# Patient Record
Sex: Male | Born: 1995 | Race: White | Hispanic: No | Marital: Single | Smoking: Current every day smoker
Health system: Southern US, Community
[De-identification: ages and names within clinical notes are randomized; demographics above are authoritative.]

---
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
## 2019-08-28 ENCOUNTER — Other Ambulatory Visit: Payer: Self-pay

## 2019-08-28 ENCOUNTER — Ambulatory Visit
Admission: EM | Admit: 2019-08-28 | Discharge: 2019-08-28 | Disposition: A | Discharge status: Home / Self Care | Payer: Commercial Managed Care - PPO | Attending: Emergency Medicine | Admitting: Emergency Medicine

## 2019-08-28 DIAGNOSIS — H60332 Swimmer's ear, left ear: Secondary | ICD-10-CM | POA: Diagnosis not present

## 2019-08-28 MED ORDER — NEOMYCIN-POLYMYXIN-HC 3.5-10000-1 OT SUSP: 4.0000 [drp] | Freq: Three times a day (TID) | OTIC | 0 refills | Status: AC

## 2019-08-28 NOTE — Discharge Instructions (Addendum)
Use for eardrops, 3 times daily as discussed at appointment. Return for worsening pain, difficulty chewing, nose pain, fever. To help prevent ear infections, use a solution of 1 part isopropyl (rubbing) alcohol, 1 part white vinegar (acetic acid) in both ears after swimming.

## 2019-08-28 NOTE — ED Provider Notes (Signed)
[facility]    CSN: [id number] Arrival date & time: 08/28/19  1151      History   Chief Complaint Chief Complaint  Patient presents with   left ear pain    HPI Allen Romero is a 23 y.o. male presenting for 2-day course of left ear pain with radiation to jaw.  States he went swimming a week ago and upon.  Tried taking 600 mg ibuprofen this morning without pain relief.  Denies history of recurrent ear infections.  Has not tried anything else for this.  Denies tinnitus, change in hearing, discharge or trauma to the area.    History reviewed. No pertinent past medical history.  There are no active problems to display for this patient.   History reviewed. No pertinent surgical history.     Home Medications    Prior to Admission medications   Medication Sig Start Date End Date Taking? Authorizing Provider  neomycin-polymyxin-hydrocortisone (CORTISPORIN) 3.5-10000-1 OTIC suspension Place 4 drops into the left ear 3 (three) times daily. 08/28/19   Hall-Potvin, [place], PA-C    Family History Family History  Problem Relation Age of Onset   Healthy Mother    Asthma Father     Social History Social History   Tobacco Use   Smoking status: Current Every Day Smoker   Smokeless tobacco: Never Used   Tobacco comment: vaping everyday  Substance Use Topics   Alcohol use: Yes    Comment: 5 beers a week   Drug use: Not on file     Allergies   Patient has no known allergies.   Review of Systems Review of Systems  Constitutional: Negative for fatigue and fever.  HENT: Positive for ear pain. Negative for congestion, dental problem, ear discharge, facial swelling, hearing loss, sinus pain, sore throat, trouble swallowing and voice change.   Eyes: Negative for photophobia, pain and visual disturbance.  Respiratory: Negative for cough and shortness of breath.   Cardiovascular: Negative for chest pain and palpitations.  Gastrointestinal: Negative for  abdominal pain, diarrhea and vomiting.  Musculoskeletal: Negative for arthralgias and myalgias.  Skin: Negative for rash and wound.  Neurological: Negative for dizziness, speech difficulty and headaches.  All other systems reviewed and are negative.    Physical Exam Triage Vital Signs ED Triage Vitals  Enc Vitals Group     BP 08/28/19 1210 110/62     Pulse Rate 08/28/19 1210 63     Resp 08/28/19 1210 16     Temp 08/28/19 1210 98 F (36.7 C)     Temp Source 08/28/19 1210 Oral     SpO2 08/28/19 1210 98 %     Weight --      Height --      Head Circumference --      Peak Flow --      Pain Score 08/28/19 1211 6     Pain Loc --      Pain Edu? --      Excl. in GC? --    No data found.  Updated Vital Signs BP 110/62 (BP Location: Left Arm)    Pulse 63    Temp 98 F (36.7 C) (Oral)    Resp 16    SpO2 98%    Physical Exam Constitutional:      General: He is not in acute distress.    Appearance: He is normal weight. He is not ill-appearing.  HENT:     Head: Normocephalic and atraumatic.  Right Ear: Tympanic membrane, ear canal and external ear normal.     Ears:     Comments: Left ear positive for tragal tenderness, mastoid process tenderness without pre-or postauricular lymphadenopathy.  EAC moderately edematous with scant waxy discharge.  TM without injection, perforation, purulent fluid interiorly.    Nose: No nasal deformity, congestion or rhinorrhea.     Comments: Turbinates nonedematous bilaterally with pink mucosa    Mouth/Throat:     Mouth: Mucous membranes are moist.     Tongue: Tongue does not deviate from midline.     Pharynx: Oropharynx is clear. Uvula midline.     Comments: No tonsillar hypertrophy or exudate Eyes:     General: No scleral icterus.    Conjunctiva/sclera: Conjunctivae normal.     Pupils: Pupils are equal, round, and reactive to light.  Neck:     Musculoskeletal: Normal range of motion and neck supple. No muscular tenderness.  Cardiovascular:      Rate and Rhythm: Normal rate.  Pulmonary:     Effort: Pulmonary effort is normal. No respiratory distress.     Breath sounds: No wheezing.  Lymphadenopathy:     Cervical: No cervical adenopathy.  Skin:    Coloration: Skin is not jaundiced or pale.  Neurological:     Mental Status: He is alert and oriented to person, place, and time.      UC Treatments / Results  Labs (all labs ordered are listed, but only abnormal results are displayed) Labs Reviewed - No data to display  EKG   Radiology No results found.  Procedures Procedures (including critical care time)  Medications Ordered in UC Medications - No data to display  Initial Impression / Assessment and Plan / UC Course  I have reviewed the triage vital signs and the nursing notes.  Pertinent labs & imaging results that were available during my care of the patient were reviewed by me and considered in my medical decision making (see chart for details).     1.  Acute swimmer's ear of left side We will initiate Cortisporin drops as listed below.  Return precautions discussed, patient verbalized understanding and is agreeable to plan. Final Clinical Impressions(s) / UC Diagnoses   Final diagnoses:  Acute swimmer's ear of left side     Discharge Instructions     Use for eardrops, 3 times daily as discussed at appointment. Return for worsening pain, difficulty chewing, nose pain, fever. To help prevent ear infections, use a solution of 1 part isopropyl (rubbing) alcohol, 1 part white vinegar (acetic acid) in both ears after swimming.    ED Prescriptions    Medication Sig Dispense Auth. Provider   neomycin-polymyxin-hydrocortisone (CORTISPORIN) 3.5-10000-1 OTIC suspension Place 4 drops into the left ear 3 (three) times daily. 10 mL Hall-Potvin, [place], PA-C     Controlled Substance Prescriptions [place] Controlled Substance Registry consulted? Not Applicable   Quincy Sheehan, [place] 08/28/19 1302

## 2019-08-28 NOTE — ED Triage Notes (Signed)
Pt presents to UC w/ c/o left ear pain starting 2 days ago. Pt states he went swimming a week ago. Pt reports taking 600mg  ibuprofen this morning. Pt denies any drainage from ear.

## 2020-04-11 ENCOUNTER — Ambulatory Visit: Payer: Commercial Managed Care - PPO | Attending: Internal Medicine

## 2020-04-11 DIAGNOSIS — Z23 Encounter for immunization: Secondary | ICD-10-CM

## 2020-04-11 NOTE — Progress Notes (Signed)
   Covid-19 Vaccination Clinic  Name:  Allen Romero    MRN: [id number] DOB: 20-May-1996  04/11/2020  Allen Romero was observed post Covid-19 immunization for 15 minutes without incident. He was provided with Vaccine Information Sheet and instruction to access the V-Safe system.   Allen Romero was instructed to call 911 with any severe reactions post vaccine: Marland Kitchen Difficulty breathing  . Swelling of face and throat  . A fast heartbeat  . A bad rash all over body  . Dizziness and weakness   Immunizations Administered    Name Date Dose VIS Date Route   Pfizer COVID-19 Vaccine 04/11/2020  4:16 PM 0.3 mL 02/14/2019 Intramuscular   Manufacturer: [employer], [employer]   Lot: [id number]   NDC: [address]-2

## 2020-05-06 ENCOUNTER — Ambulatory Visit: Payer: Commercial Managed Care - PPO | Attending: Internal Medicine

## 2020-05-06 DIAGNOSIS — Z23 Encounter for immunization: Secondary | ICD-10-CM

## 2020-05-06 NOTE — Progress Notes (Signed)
   Covid-19 Vaccination Clinic  Name:  Allen Romero    MRN: [id number] DOB: [birth year]  05/06/2020  Mr. Wise was observed post Covid-19 immunization for 15 minutes without incident. He was provided with Vaccine Information Sheet and instruction to access the V-Safe system.   [name] was instructed to call 911 with any severe reactions post vaccine: Marland Kitchen Difficulty breathing  . Swelling of face and throat  . A fast heartbeat  . A bad rash all over body  . Dizziness and weakness   Immunizations Administered    Name Date Dose VIS Date Route   Pfizer COVID-19 Vaccine 05/06/2020  4:06 PM 0.3 mL 02/14/2019 Intramuscular   Manufacturer: [employer], [employer]   Lot: [id number]   NDC: [address]-5

## 2020-06-01 ENCOUNTER — Ambulatory Visit: Payer: Self-pay

## 2022-10-22 ENCOUNTER — Other Ambulatory Visit (HOSPITAL_BASED_OUTPATIENT_CLINIC_OR_DEPARTMENT_OTHER): Payer: Self-pay

## 2022-10-22 ENCOUNTER — Other Ambulatory Visit (HOSPITAL_COMMUNITY): Payer: Self-pay

## 2022-10-22 MED ORDER — LISDEXAMFETAMINE DIMESYLATE 60 MG PO CAPS
60.0000 mg | ORAL_CAPSULE | Freq: Every day | ORAL | 0 refills | Status: DC
  Filled 2022-10-22: qty 30, 30d supply, fill #0

## 2022-10-22 MED ORDER — LISDEXAMFETAMINE DIMESYLATE 60 MG PO CHEW: 60.0000 mg | CHEWABLE_TABLET | Freq: Every day | ORAL | 0 refills | Status: AC

## 2022-10-22 MED ORDER — LISDEXAMFETAMINE DIMESYLATE 60 MG PO CAPS: 60.0000 mg | ORAL_CAPSULE | Freq: Every day | ORAL | 0 refills | Status: DC

## 2022-10-22 MED ORDER — LISDEXAMFETAMINE DIMESYLATE 60 MG PO CHEW
1.0000 | CHEWABLE_TABLET | Freq: Every day | ORAL | 0 refills | Status: AC
  Filled 2022-10-22: qty 30, 30d supply, fill #0

## 2022-10-29 ENCOUNTER — Other Ambulatory Visit (HOSPITAL_BASED_OUTPATIENT_CLINIC_OR_DEPARTMENT_OTHER): Payer: Self-pay

## 2022-12-07 ENCOUNTER — Other Ambulatory Visit (HOSPITAL_COMMUNITY): Payer: Self-pay

## 2022-12-07 MED ORDER — LISDEXAMFETAMINE DIMESYLATE 60 MG PO CAPS
60.0000 mg | ORAL_CAPSULE | Freq: Every day | ORAL | 0 refills | Status: AC
  Filled 2022-12-07: qty 30, 30d supply, fill #0
